# Patient Record
Sex: Male | Born: 2000 | Race: White | Hispanic: No | Marital: Single | Smoking: Never smoker
Health system: Southern US, Community
[De-identification: ages and names within clinical notes are randomized; demographics above are authoritative.]

---
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
## 2023-12-06 IMAGING — DX DG HAND COMPLETE 3+V*L*
3 series · 3 of 3 positions shown · non-contrast
Comparison: None.

CLINICAL DATA: Nail puncture wound along the palm of the left hand

EXAM:
LEFT HAND - COMPLETE 3+ VIEW

[hand ap]
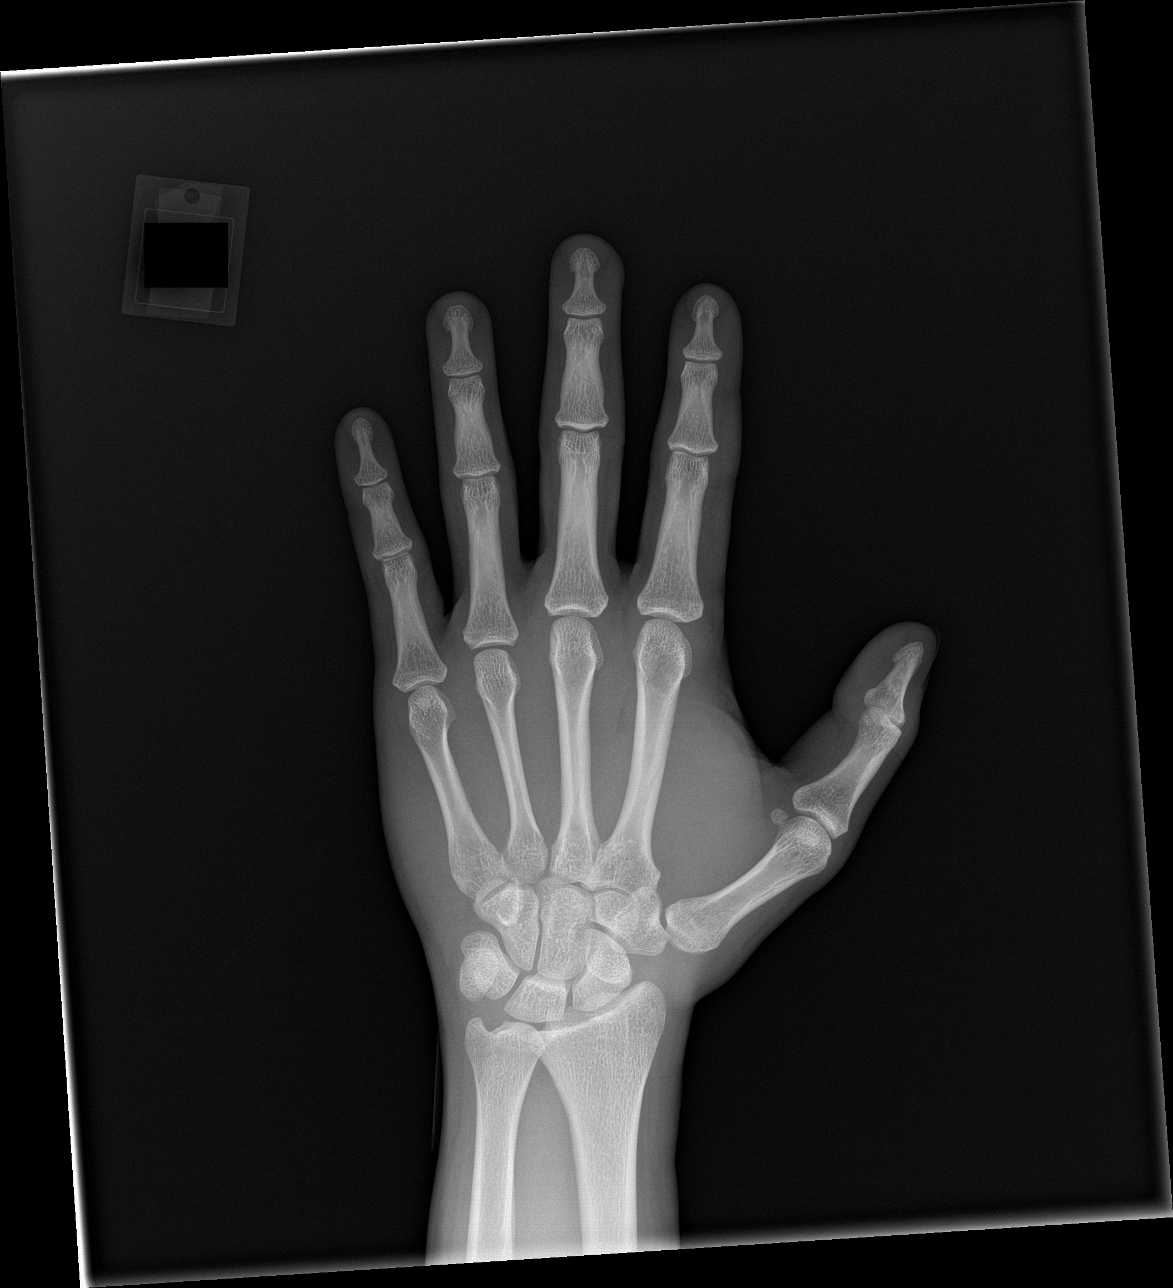

[hand obl]
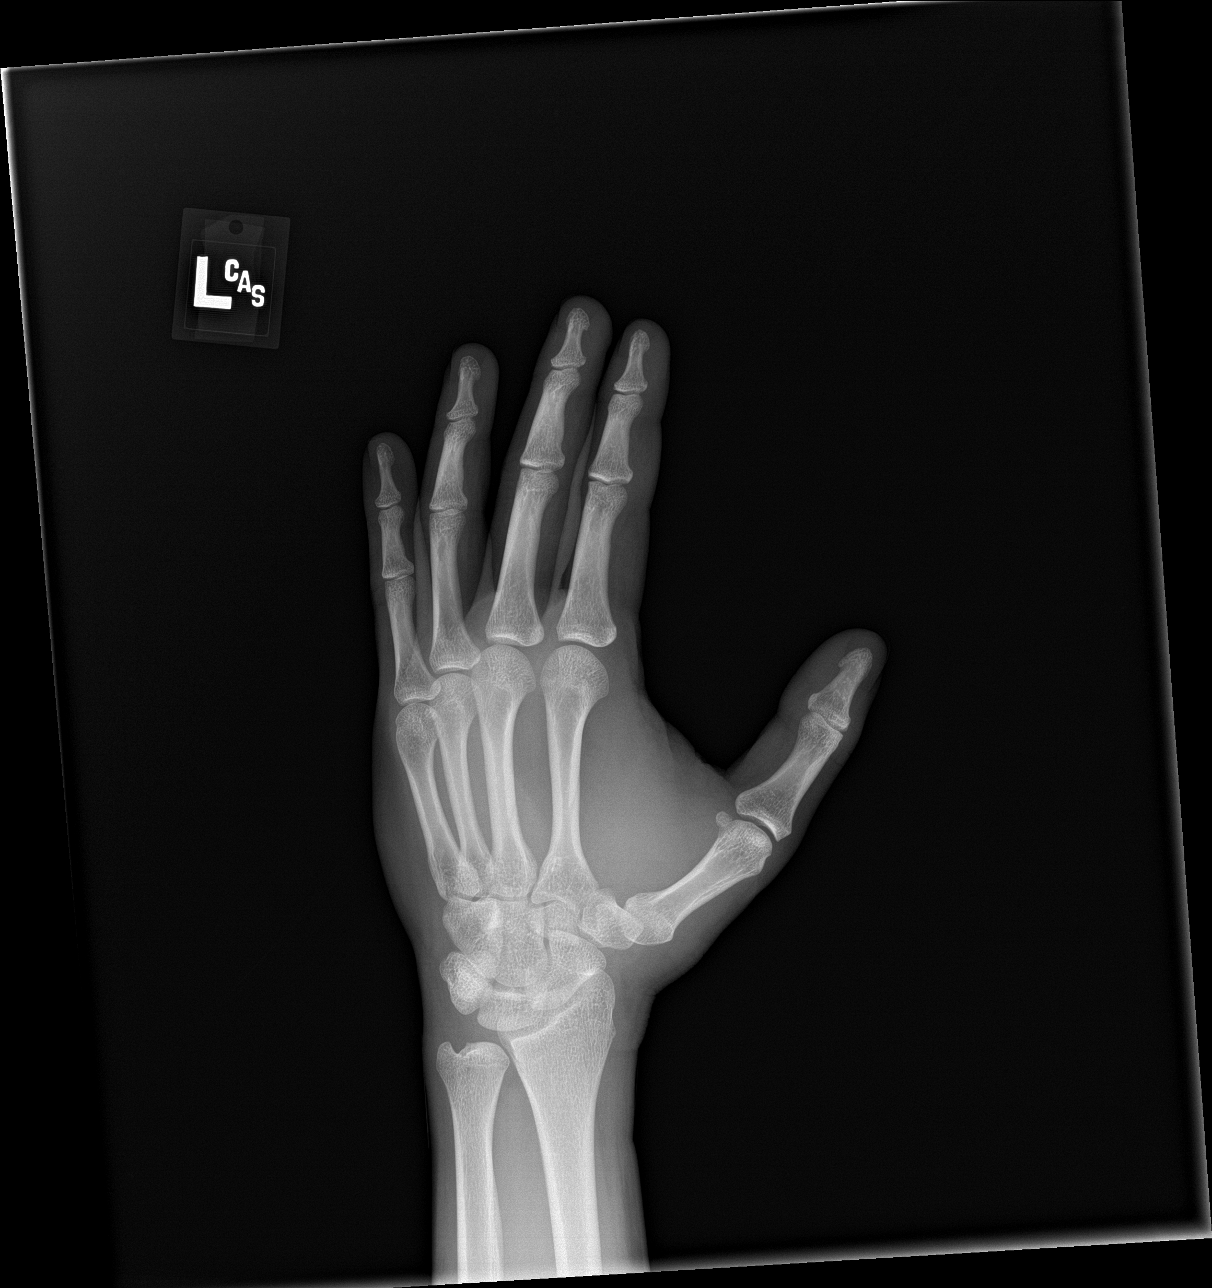

[hand lat]
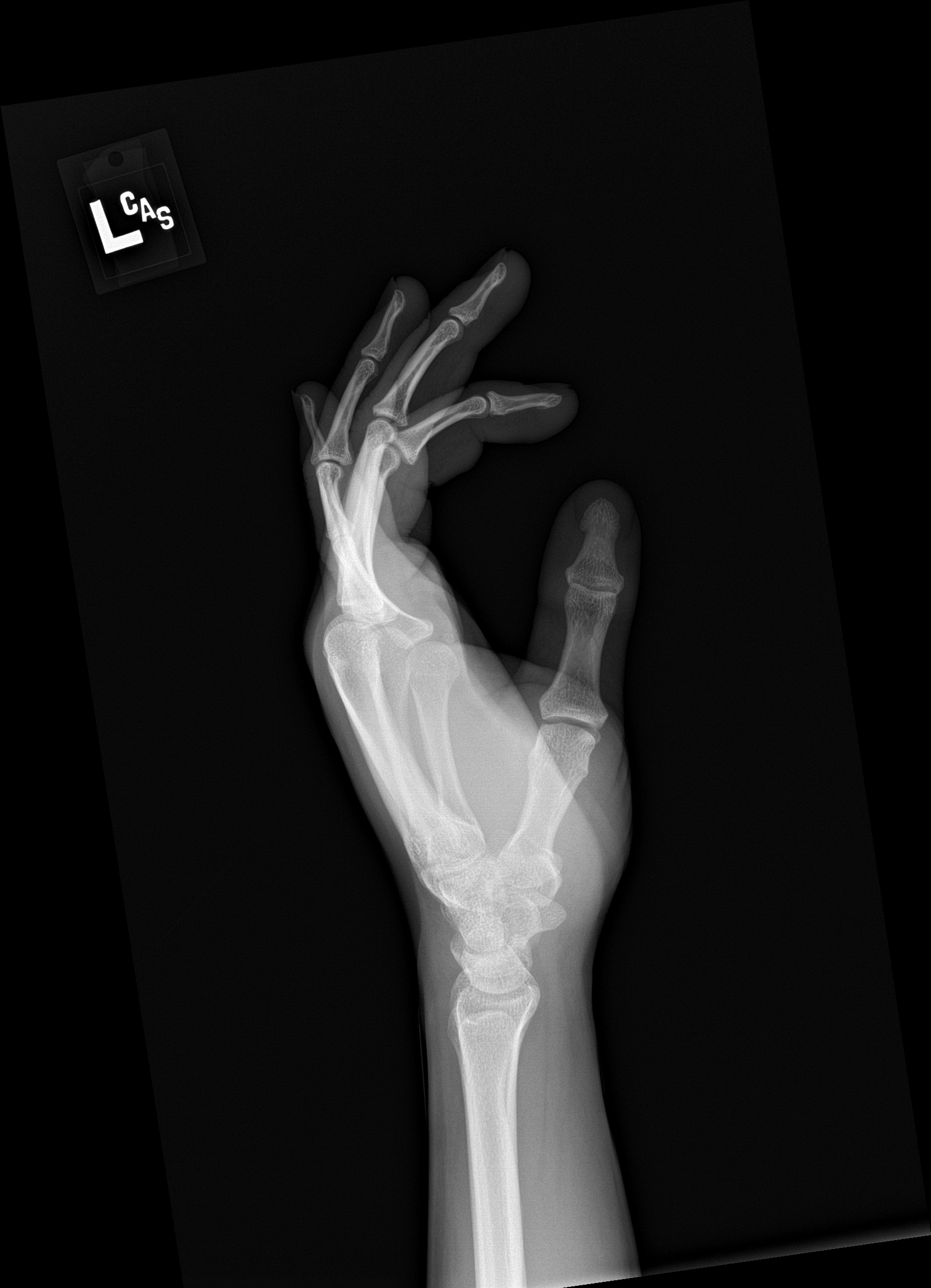

[3 of 3 positions shown; findings below may reference images not displayed]

FINDINGS: No fracture or osseous malalignment. No metal foreign body or other
definite foreign body identified in the hand. No gas tracking along
the soft tissues observed.
IMPRESSION: 1.  No significant abnormality identified.
